# Patient Record
Sex: Male | Born: 2000 | Race: Black or African American | Hispanic: No | Marital: Single | Smoking: Current every day smoker
Health system: Southern US, Community
[De-identification: ages and names within clinical notes are randomized; demographics above are authoritative.]

---
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
## 2022-11-16 ENCOUNTER — Ambulatory Visit
Admission: RE | Admit: 2022-11-16 | Discharge: 2022-11-16 | Disposition: A | Discharge status: Home / Self Care | Payer: Commercial Managed Care - PPO | Source: Ambulatory Visit | Attending: Nurse Practitioner | Admitting: Nurse Practitioner

## 2022-11-16 VITALS — BP 115/69 | HR 58 | Temp 97.8°F | Resp 14

## 2022-11-16 DIAGNOSIS — R21 Rash and other nonspecific skin eruption: Secondary | ICD-10-CM | POA: Diagnosis not present

## 2022-11-16 MED ORDER — HYDROCORTISONE 0.5 % EX OINT: 1.0000 | TOPICAL_OINTMENT | Freq: Two times a day (BID) | CUTANEOUS | 0 refills | Status: AC

## 2022-11-16 NOTE — Discharge Instructions (Signed)
Use the hydrocortisone ointment on the rash twice daily.  You can apply a thin layer of Vaseline or Aquaphor over the hydrocortisone ointment.  This should help the areas the dry up and heal and also help with itching.  Seek care if symptoms persist or worsen despite treatment.

## 2022-11-16 NOTE — ED Triage Notes (Signed)
Pt reports rash in abdomen, legs and back x 4-5 days.

## 2022-11-16 NOTE — ED Provider Notes (Signed)
[facility]    CSN: [id number] Arrival date & time: 11/16/22  1206      History   Chief Complaint Chief Complaint  Patient presents with   Rash    Entered by patient   Appointment    21    HPI Phillip Schwartz is a 22 y.o. male.   Patient presents today with a few day history of rash on his arms and trunk.  Reports it is "sometimes" itchy.  No burning, redness, oozing, scaling, blisters, pain.  No fever, recent cough, congestion, or sore throat.  No history of similar.  No recent change in detergents, soaps, personal care products.  No new medication use.  Mom reports they use Tide for sensitive skin, nonscented Dove, hypoallergenic personal care products in general.  Patient reports rash is the same as when it first started.  Has not tried anything for symptoms so far.  No shortness of breath, throat or tongue swelling, new muscle pain or joint aches.    History reviewed. No pertinent past medical history.  There are no problems to display for this patient.   History reviewed. No pertinent surgical history.     Home Medications    Prior to Admission medications   Medication Sig Start Date End Date Taking? Authorizing Provider  hydrocortisone ointment 0.5 % Apply 1 Application topically 2 (two) times daily. 11/16/22  Yes Eulogio Bear, NP    Family History Family History  Problem Relation Age of Onset   Diabetes Maternal Grandmother    Hypertension Maternal Grandmother    Atrial fibrillation Maternal Grandmother    Hypertension Maternal Aunt    Hypertension Other    Diabetes Other    Alzheimer's disease Other     Social History Social History   Tobacco Use   Smoking status: Every Day    Types: Cigarettes, E-cigarettes   Smokeless tobacco: Never  Substance Use Topics   Alcohol use: Never   Drug use: Never     Allergies   Shellfish allergy   Review of Systems Review of Systems Per HPI  Physical Exam Triage Vital Signs ED  Triage Vitals  Enc Vitals Group     BP 11/16/22 1212 115/69     Pulse Rate 11/16/22 1212 (!) 58     Resp 11/16/22 1212 14     Temp 11/16/22 1212 97.8 F (36.6 C)     Temp Source 11/16/22 1212 Oral     SpO2 11/16/22 1212 98 %     Weight --      Height --      Head Circumference --      Peak Flow --      Pain Score 11/16/22 1214 0     Pain Loc --      Pain Edu? --      Excl. in [place]? --    No data found.  Updated Vital Signs BP 115/69 (BP Location: Right Arm)   Pulse (!) 58   Temp 97.8 F (36.6 C) (Oral)   Resp 14   SpO2 98%   Visual Acuity Right Eye Distance:   Left Eye Distance:   Bilateral Distance:    Right Eye Near:   Left Eye Near:    Bilateral Near:     Physical Exam Vitals and nursing note reviewed.  Constitutional:      General: He is not in acute distress.    Appearance: Normal appearance. He is not toxic-appearing.  HENT:  Head: Normocephalic and atraumatic.     Mouth/Throat:     Mouth: Mucous membranes are moist.     Pharynx: Oropharynx is clear.  Pulmonary:     Effort: Pulmonary effort is normal. No respiratory distress.  Skin:    General: Skin is warm and dry.     Capillary Refill: Capillary refill takes less than 2 seconds.     Findings: Rash present.     Comments: Multiple localized hyperpigmented plaques to bilateral arms, lower abdomen, lower back.  No tenderness to palpation, surrounding erythema, warmth, active drainage.  Neurological:     Mental Status: He is alert and oriented to person, place, and time.  Psychiatric:        Behavior: Behavior is cooperative.      UC Treatments / Results  Labs (all labs ordered are listed, but only abnormal results are displayed) Labs Reviewed - No data to display  EKG   Radiology No results found.  Procedures Procedures (including critical care time)  Medications Ordered in UC Medications - No data to display  Initial Impression / Assessment and Plan / UC Course  I have reviewed the  triage vital signs and the nursing notes.  Pertinent labs & imaging results that were available during my care of the patient were reviewed by me and considered in my medical decision making (see chart for details).   Patient is well-appearing, normotensive, afebrile, not tachycardic, not tachypneic, oxygenating well on room air.    Rash and nonspecific skin eruption Suspect contact dermatitis Treat with topical hydrocortisone ointment with emollient over top twice daily ER and return precautions discussed Examination is reassuring and no red flags Seek care if symptoms persist or worsen despite treatment  The patient was given the opportunity to ask questions.  All questions answered to their satisfaction.  The patient is in agreement to this plan.    Final Clinical Impressions(s) / UC Diagnoses   Final diagnoses:  Rash and nonspecific skin eruption     Discharge Instructions      Use the hydrocortisone ointment on the rash twice daily.  You can apply a thin layer of Vaseline or Aquaphor over the hydrocortisone ointment.  This should help the areas the dry up and heal and also help with itching.  Seek care if symptoms persist or worsen despite treatment.     ED Prescriptions     Medication Sig Dispense Auth. Provider   hydrocortisone ointment 0.5 % Apply 1 Application topically 2 (two) times daily. 30 g Eulogio Bear, NP      PDMP not reviewed this encounter.   Eulogio Bear, NP 11/16/22 1229

## 2023-05-15 ENCOUNTER — Other Ambulatory Visit: Payer: Self-pay

## 2023-05-15 ENCOUNTER — Emergency Department (HOSPITAL_COMMUNITY): Payer: Commercial Managed Care - PPO

## 2023-05-15 ENCOUNTER — Encounter (HOSPITAL_COMMUNITY): Payer: Self-pay

## 2023-05-15 ENCOUNTER — Emergency Department (HOSPITAL_COMMUNITY)
Admission: EM | Admit: 2023-05-15 | Discharge: 2023-05-15 | Disposition: A | Discharge status: Home / Self Care | Payer: Commercial Managed Care - PPO | Attending: Emergency Medicine | Admitting: Emergency Medicine

## 2023-05-15 DIAGNOSIS — S060XAA Concussion with loss of consciousness status unknown, initial encounter: Secondary | ICD-10-CM | POA: Insufficient documentation

## 2023-05-15 DIAGNOSIS — S299XXA Unspecified injury of thorax, initial encounter: Secondary | ICD-10-CM | POA: Insufficient documentation

## 2023-05-15 DIAGNOSIS — S0990XA Unspecified injury of head, initial encounter: Secondary | ICD-10-CM | POA: Diagnosis present

## 2023-05-15 DIAGNOSIS — S161XXA Strain of muscle, fascia and tendon at neck level, initial encounter: Secondary | ICD-10-CM | POA: Diagnosis not present

## 2023-05-15 DIAGNOSIS — Z23 Encounter for immunization: Secondary | ICD-10-CM | POA: Insufficient documentation

## 2023-05-15 DIAGNOSIS — F10920 Alcohol use, unspecified with intoxication, uncomplicated: Secondary | ICD-10-CM | POA: Diagnosis not present

## 2023-05-15 DIAGNOSIS — S298XXA Other specified injuries of thorax, initial encounter: Secondary | ICD-10-CM

## 2023-05-15 LAB — COMPREHENSIVE METABOLIC PANEL WITH GFR
ALT: 51 U/L — ABNORMAL HIGH (ref 0–44)
AST: 63 U/L — ABNORMAL HIGH (ref 15–41)
Albumin: 4.1 g/dL (ref 3.5–5.0)
Alkaline Phosphatase: 48 U/L (ref 38–126)
Anion gap: 13 (ref 5–15)
BUN: 7 mg/dL (ref 6–20)
CO2: 19 mmol/L — ABNORMAL LOW (ref 22–32)
Calcium: 8.7 mg/dL — ABNORMAL LOW (ref 8.9–10.3)
Chloride: 105 mmol/L (ref 98–111)
Creatinine, Ser: 1.12 mg/dL (ref 0.61–1.24)
GFR, Estimated: >60 mL/min
Glucose, Bld: 91 mg/dL (ref 70–99)
Potassium: 4.2 mmol/L (ref 3.5–5.1)
Sodium: 137 mmol/L (ref 135–145)
Total Bilirubin: 1.1 mg/dL (ref 0.3–1.2)
Total Protein: 6.7 g/dL (ref 6.5–8.1)

## 2023-05-15 LAB — CBC
HCT: 39.3 % (ref 39.0–52.0)
Hemoglobin: 13.3 g/dL (ref 13.0–17.0)
MCH: 32 pg (ref 26.0–34.0)
MCHC: 33.8 g/dL (ref 30.0–36.0)
MCV: 94.5 fL (ref 80.0–100.0)
Platelets: 262 10*3/uL (ref 150–400)
RBC: 4.16 MIL/uL — ABNORMAL LOW (ref 4.22–5.81)
RDW: 12.4 % (ref 11.5–15.5)
WBC: 6.3 10*3/uL (ref 4.0–10.5)
nRBC: 0 % (ref 0.0–0.2)

## 2023-05-15 LAB — ETHANOL: Alcohol, Ethyl (B): 213 mg/dL — ABNORMAL HIGH

## 2023-05-15 MED ORDER — TETANUS-DIPHTH-ACELL PERTUSSIS 5-2.5-18.5 LF-MCG/0.5 IM SUSY
0.5000 mL | PREFILLED_SYRINGE | Freq: Once | INTRAMUSCULAR | Status: AC
  Administered 2023-05-15: 0.5 mL via INTRAMUSCULAR
  Filled 2023-05-15: qty 0.5

## 2023-05-15 MED ORDER — METHOCARBAMOL 500 MG PO TABS: 500.0000 mg | ORAL_TABLET | Freq: Two times a day (BID) | ORAL | 0 refills | Status: AC

## 2023-05-15 NOTE — ED Notes (Signed)
Patient transported to CT scan . 

## 2023-05-15 NOTE — ED Notes (Addendum)
Patient agitated arguing with family members inside room /wants to leave AMA . EDP notified .
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
## 2023-05-15 NOTE — ED Notes (Signed)
Patient refused Tetanus injection.

## 2023-05-15 NOTE — Discharge Instructions (Signed)
You have neck pain, possibly from a cervical strain and/or pinched nerve.  ° °SEEK IMMEDIATE MEDICAL ATTENTION IF: °You develop difficulties swallowing or breathing.  °You have new or worse numbness, weakness, tingling, or movement problems in your arms or legs.  °You develop increasing pain which is uncontrolled with medications.  °You have change in bowel or bladder function, or other concerns. ° ° ° °
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
## 2023-05-15 NOTE — Progress Notes (Signed)
Orthopedic Tech Progress Note Patient Details:  Phillip Schwartz 20-Feb-2001 [id number]  Patient ID: Phillip Schwartz, male   DOB: [birth year], 22 y.o.   MRN: [id number] I attended trauma page. [name] 05/15/2023, 3:33 AM
